# Patient Record
Sex: Female | Born: 1990 | Race: White | Hispanic: No | Marital: Single | Smoking: Never smoker
Health system: Southern US, Community
[De-identification: ages and names within clinical notes are randomized; demographics above are authoritative.]

---

## 2010-01-25 ENCOUNTER — Emergency Department (HOSPITAL_BASED_OUTPATIENT_CLINIC_OR_DEPARTMENT_OTHER): Admission: EM | Admit: 2010-01-25 | Discharge: 2010-01-25 | Payer: Self-pay | Admitting: Emergency Medicine

## 2010-01-25 ENCOUNTER — Ambulatory Visit: Payer: Self-pay | Admitting: Diagnostic Radiology

## 2010-01-28 ENCOUNTER — Emergency Department (HOSPITAL_BASED_OUTPATIENT_CLINIC_OR_DEPARTMENT_OTHER): Admission: EM | Admit: 2010-01-28 | Discharge: 2010-01-28 | Payer: Self-pay | Admitting: Emergency Medicine

## 2010-01-28 ENCOUNTER — Ambulatory Visit: Payer: Self-pay | Admitting: Diagnostic Radiology

## 2011-02-13 LAB — DIFFERENTIAL
Basophils Absolute: 0.1 10*3/uL (ref 0.0–0.1)
Basophils Absolute: 0.1 10*3/uL (ref 0.0–0.1)
Basophils Relative: 1 % (ref 0–1)
Lymphocytes Relative: 21 % (ref 12–46)
Lymphs Abs: 2.3 10*3/uL (ref 0.7–4.0)
Neutro Abs: 8.2 10*3/uL — ABNORMAL HIGH (ref 1.7–7.7)

## 2011-02-13 LAB — COMPREHENSIVE METABOLIC PANEL
ALT: 9 U/L (ref 0–35)
AST: 19 U/L (ref 0–37)
Albumin: 4.4 g/dL (ref 3.5–5.2)
Alkaline Phosphatase: 85 U/L (ref 39–117)
BUN: 7 mg/dL (ref 6–23)
CO2: 24 mEq/L (ref 19–32)
Calcium: 9.5 mg/dL (ref 8.4–10.5)
Calcium: 9.7 mg/dL (ref 8.4–10.5)
Chloride: 107 mEq/L (ref 96–112)
Creatinine, Ser: 0.8 mg/dL (ref 0.4–1.2)
Creatinine, Ser: 0.9 mg/dL (ref 0.4–1.2)
GFR calc Af Amer: >60 mL/min (ref >60)
GFR calc non Af Amer: >60 mL/min (ref >60)
Glucose, Bld: 93 mg/dL (ref 70–99)
Potassium: 3.3 mEq/L — ABNORMAL LOW (ref 3.5–5.1)
Potassium: 4 mEq/L (ref 3.5–5.1)
Sodium: 143 mEq/L (ref 135–145)
Sodium: 144 mEq/L (ref 135–145)
Total Bilirubin: 0.6 mg/dL (ref 0.3–1.2)
Total Protein: 7.5 g/dL (ref 6.0–8.3)
Total Protein: 8 g/dL (ref 6.0–8.3)

## 2011-02-13 LAB — CBC
Hemoglobin: 13.1 g/dL (ref 12.0–15.0)
MCHC: 35 g/dL (ref 30.0–36.0)
Platelets: 249 10*3/uL (ref 150–400)
WBC: 11.1 10*3/uL — ABNORMAL HIGH (ref 4.0–10.5)

## 2011-02-13 LAB — URINALYSIS, ROUTINE W REFLEX MICROSCOPIC
Bilirubin Urine: NEGATIVE
Glucose, UA: NEGATIVE mg/dL
Hgb urine dipstick: NEGATIVE
Nitrite: NEGATIVE
Protein, ur: NEGATIVE mg/dL
pH: 7.5 (ref 5.0–8.0)

## 2011-02-13 LAB — WET PREP, GENITAL
Trich, Wet Prep: NONE SEEN
Yeast Wet Prep HPF POC: NONE SEEN

## 2011-02-13 LAB — PREGNANCY, URINE: Preg Test, Ur: NEGATIVE

## 2011-02-13 LAB — LIPASE, BLOOD
Lipase: 55 U/L (ref 23–300)
Lipase: 68 U/L (ref 23–300)

## 2011-02-13 LAB — GC/CHLAMYDIA PROBE AMP, GENITAL: Chlamydia, DNA Probe: NEGATIVE

## 2011-09-30 IMAGING — CT CT ABD-PELV W/ CM
2 of 4 series · 16 of 46 positions shown, 18 images · IV contrast (APPLIED)
Comparison: Ultrasound of the abdomen 01/25/2010

CLINICAL DATA: Severe abdominal pain in the left upper quadrant.
Pain is getting progressively worse.  Pain is worse after eating.

CT ABDOMEN AND PELVIS WITH CONTRAST
TECHNIQUE: Multidetector CT imaging of the abdomen and pelvis was
performed following the standard protocol during bolus
administration of intravenous contrast.
Contrast: 100 ml Emnipaque-B55

[Series 2: abd/pelvis 5.0 b31f · axial · 0.68mm/px · z∈[-517,-97]mm · 13 of 92 slices shown, 15 images]
[im 4/92  soft-tissue]
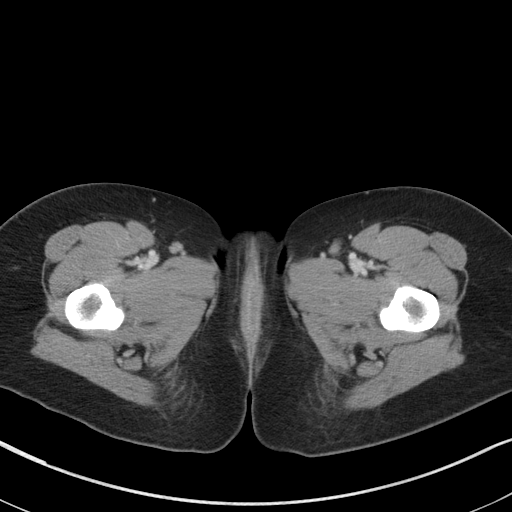
[im 4/92  bone]
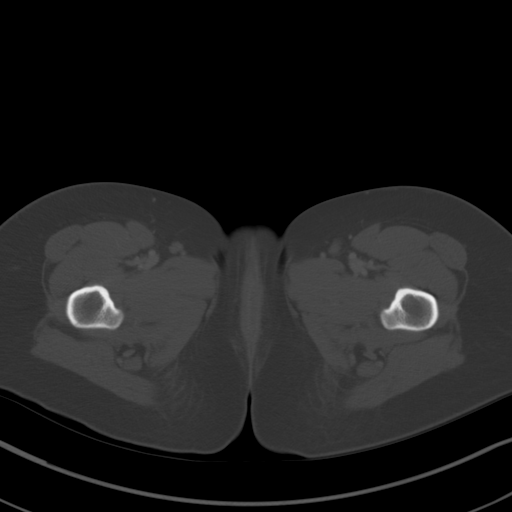
[im 12/92  soft-tissue]
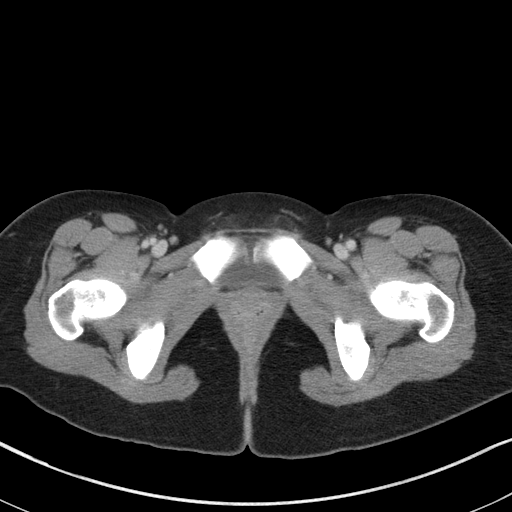
[im 19/92  soft-tissue]
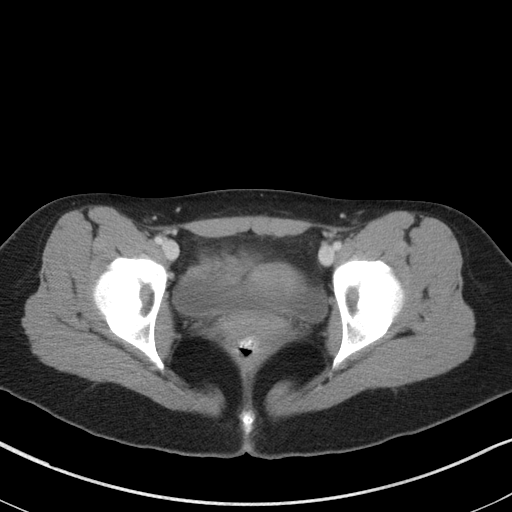
[im 27/92  soft-tissue]
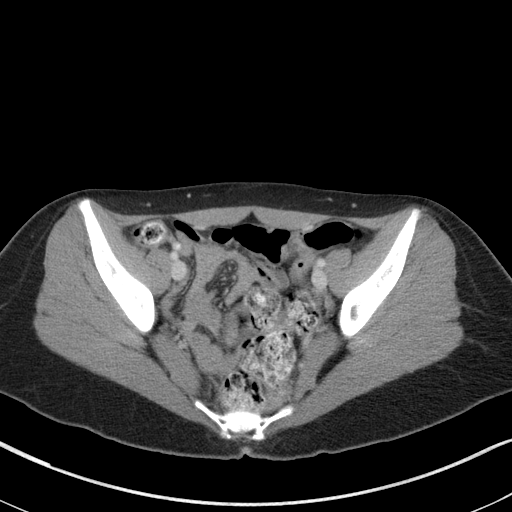
[im 31/92  soft-tissue]
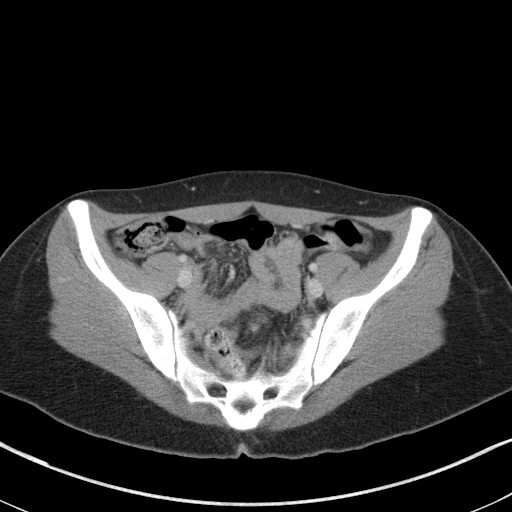
[im 38/92  soft-tissue]
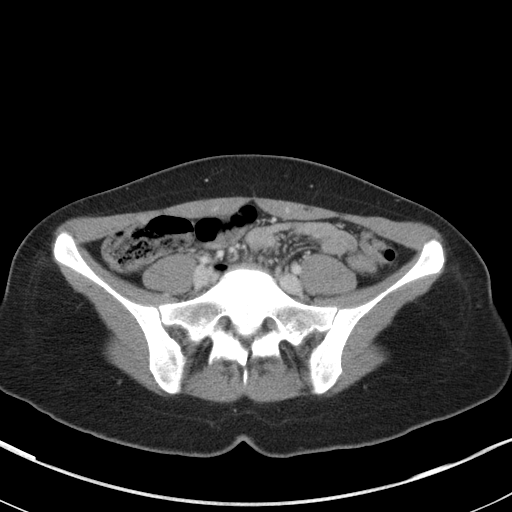
[im 46/92  soft-tissue]
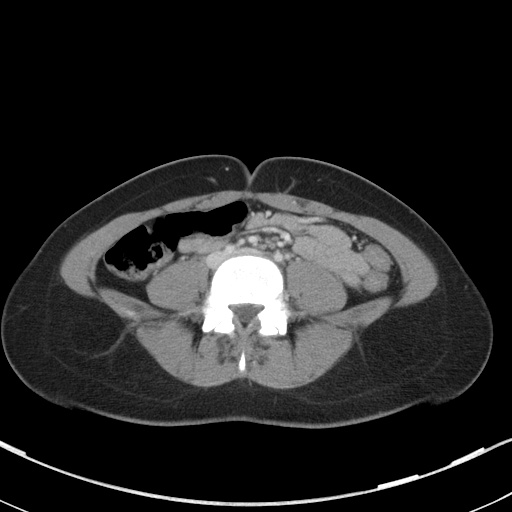
[im 54/92  soft-tissue]
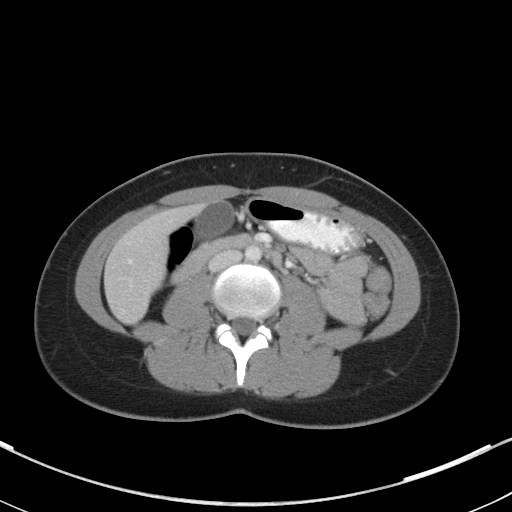
[im 61/92  soft-tissue]
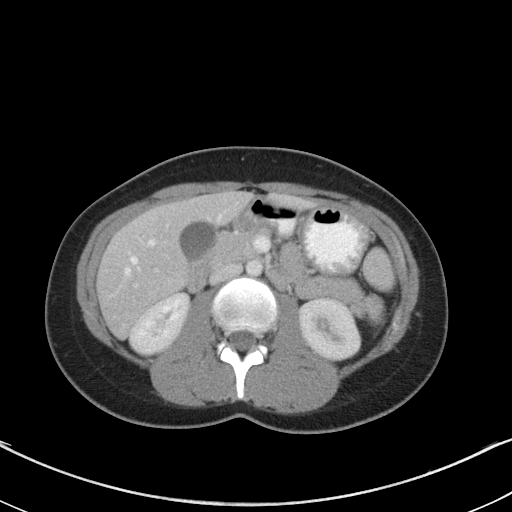
[im 61/92  bone]
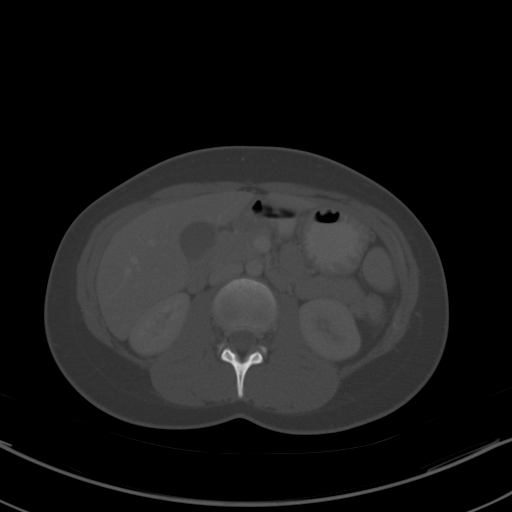
[im 65/92  soft-tissue]
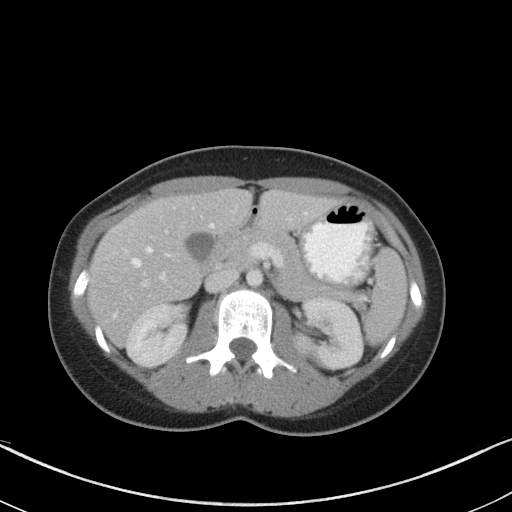
[im 73/92  soft-tissue]
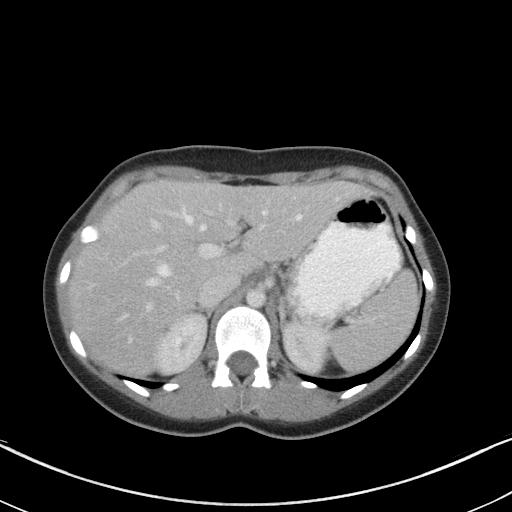
[im 80/92  soft-tissue]
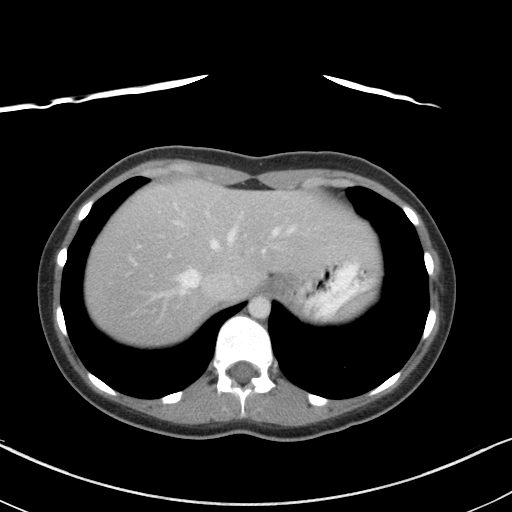
[im 88/92  soft-tissue]
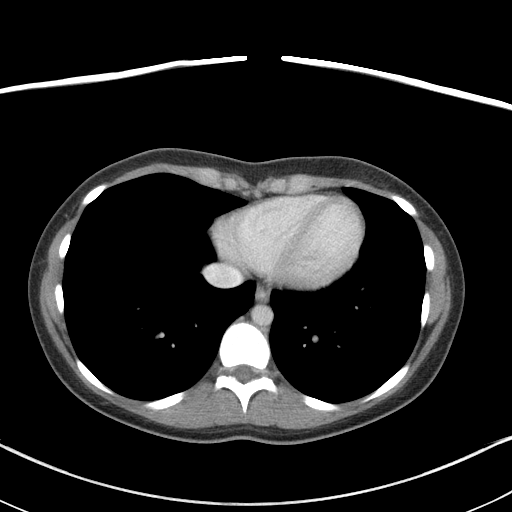

[Series 5: abd/pelvis 3.0 coronal · coronal · 0.69mm/px · 3 of 69 slices shown]
[im 23/69  soft-tissue]
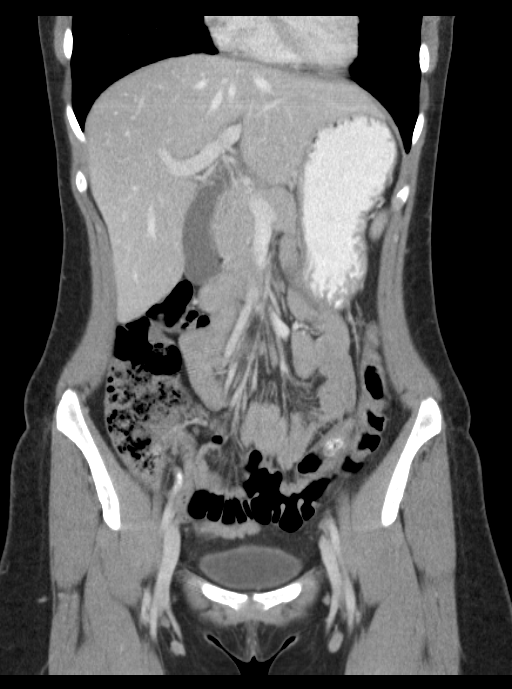
[im 31/69  soft-tissue]
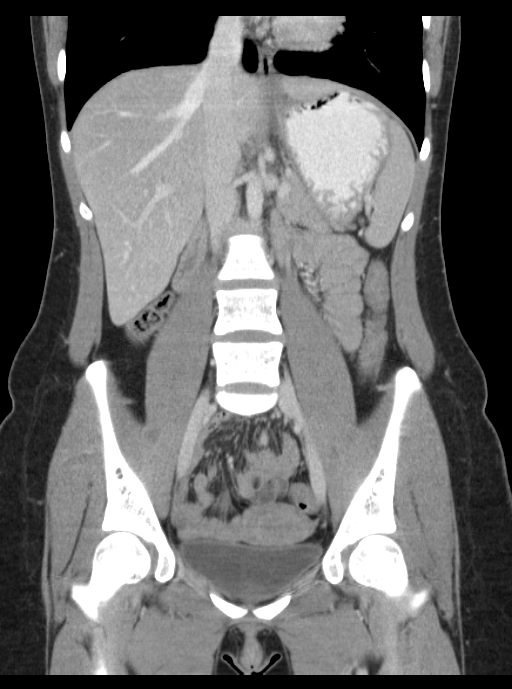
[im 38/69  soft-tissue]
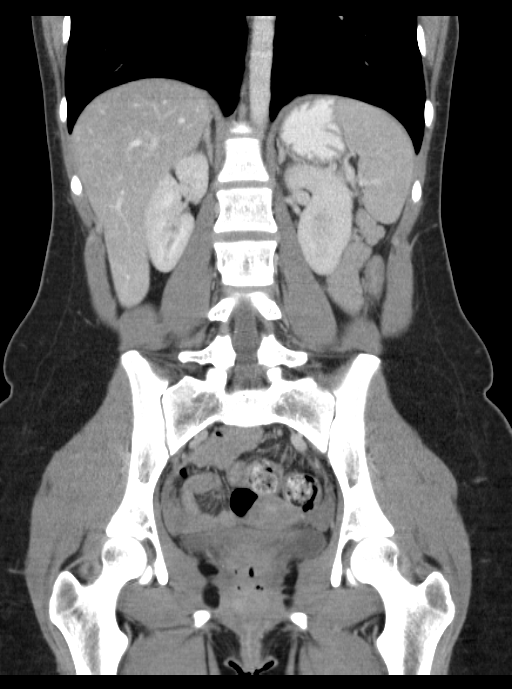

[16 of 46 positions shown; findings below may reference images not displayed]

FINDINGS: Images of the lung bases are unremarkable.  There is
focal fatty infiltration adjacent to the falciform ligament of the
liver.  No other focal abnormalities identified within the liver,
spleen, pancreas, adrenal glands, or kidneys.  The gallbladder is
present.  No retroperitoneal adenopathy or ascites.  The appendix
is well seen and has a normal appearance.  No evidence for bowel
obstruction or bowel wall thickening.

The uterus is present.  No adnexal mass or free pelvic fluid.
IMPRESSION: No evidence for acute abdominal or pelvic abnormality.

## 2014-10-05 ENCOUNTER — Ambulatory Visit (INDEPENDENT_AMBULATORY_CARE_PROVIDER_SITE_OTHER): Payer: BC Managed Care – PPO | Admitting: Emergency Medicine

## 2014-10-05 VITALS — BP 120/65 | HR 92 | Temp 98.3°F | Resp 18 | Ht 65.75 in | Wt 140.0 lb

## 2014-10-05 DIAGNOSIS — G8929 Other chronic pain: Secondary | ICD-10-CM

## 2014-10-05 DIAGNOSIS — R1031 Right lower quadrant pain: Secondary | ICD-10-CM

## 2014-10-05 DIAGNOSIS — Z3202 Encounter for pregnancy test, result negative: Secondary | ICD-10-CM

## 2014-10-05 LAB — POCT CBC
Granulocyte percent: 68.6 %G (ref 37–80)
HCT, POC: 38.5 % (ref 37.7–47.9)
Hemoglobin: 12.2 g/dL (ref 12.2–16.2)
Lymph, poc: 4.8 — AB (ref 0.6–3.4)
MCH, POC: 30 pg (ref 27–31.2)
MCHC: 31.8 g/dL (ref 31.8–35.4)
MCV: 94.2 fL (ref 80–97)
MID (CBC): 0.5 (ref 0–0.9)
MPV: 8.2 fL (ref 0–99.8)
PLATELET COUNT, POC: 249 10*3/uL (ref 142–424)
POC GRANULOCYTE: 11.7 — AB (ref 2–6.9)
POC LYMPH PERCENT: 28.4 %L (ref 10–50)
POC MID %: 3 %M (ref 0–12)
RBC: 4.08 M/uL (ref 4.04–5.48)
RDW, POC: 13.9 %
WBC: 17 10*3/uL — AB (ref 4.6–10.2)

## 2014-10-05 LAB — POCT URINALYSIS DIPSTICK
Bilirubin, UA: NEGATIVE
GLUCOSE UA: NEGATIVE
KETONES UA: NEGATIVE
Leukocytes, UA: NEGATIVE
NITRITE UA: NEGATIVE
PH UA: 5.5
PROTEIN UA: NEGATIVE
RBC UA: NEGATIVE
UROBILINOGEN UA: 0.2

## 2014-10-05 LAB — POCT UA - MICROSCOPIC ONLY
CASTS, UR, LPF, POC: NEGATIVE
CRYSTALS, UR, HPF, POC: NEGATIVE
Mucus, UA: NEGATIVE
Yeast, UA: NEGATIVE

## 2014-10-05 LAB — POCT URINE PREGNANCY: PREG TEST UR: NEGATIVE

## 2014-10-05 NOTE — Progress Notes (Signed)
[facility] [address], [place] [id number] [phone]  Date:  10/05/2014   Name:  Brooke Romero   DOB:  06/19/1991   MRN:  [id number]  PCP:  No primary care provider on file.    Chief Complaint: Abdominal Pain   History of Present Illness:  Brooke Romero is a 23 y.o. very pleasant female patient who presents with the following:  Pain in right abdomen that is crampy in nature for the past 10 days.  No vaginal discharge or dyspareunia.   On OCP LMP 09/11/14 and normal   No missed pills. Has had intermittent cramps until yesterday and was worse.  Today constant and much worse. No fever or chills.  No dysuria or urgency or frequency. Vomited once this morning. Now anorectic.  Moved stools twice today. No cough or coryza. No improvement with over the counter medications or other home remedies.  Denies other complaint or health concern today.   There are no active problems to display for this patient.   No past medical history on file.  No past surgical history on file.  History  Substance Use Topics  . Smoking status: Never Smoker   . Smokeless tobacco: Not on file  . Alcohol Use: 0.0 oz/week    0 Not specified per week    No family history on file.  Allergies  Allergen Reactions  . Amoxicillin   . Augmentin [Amoxicillin-Pot Clavulanate]     Medication list has been reviewed and updated.  No current outpatient prescriptions on file prior to visit.   No current facility-administered medications on file prior to visit.    Review of Systems:  As per HPI, otherwise negative.    Physical Examination: Filed Vitals:   10/05/14 2030  BP: 120/65  Pulse: 92  Temp: 98.3 F (36.8 C)  Resp: 18   Filed Vitals:   10/05/14 2030  Height: 5' 5.75" (1.67 m)  Weight: 140 lb (63.504 kg)   Body mass index is 22.77 kg/(m^2). Ideal Body Weight: Weight in (lb) to have BMI = 25: 153.4  GEN: WDWN, minimally ill appearing.  Well hydrated,  Non-toxic, A & O x 3 HEENT: Atraumatic, Normocephalic. Neck supple. No masses, No LAD. Ears and Nose: No external deformity. CV: RRR, No M/G/R. No JVD. No thrill. No extra heart sounds. PULM: CTA B, no wheezes, crackles, rhonchi. No retractions. No resp. distress. No accessory muscle use. ABD: S, tender right abdomen with no guarding., ND, +BS. Questionable direct rebound. No HSM. EXTR: No c/c/e NEURO Normal gait.  PSYCH: Normally interactive. Conversant. Not depressed or anxious appearing.  Calm demeanor.    Assessment and Plan: Acute abdomen To ER via POV  Signed,  Phillips OdorJeffery Anderson, MD   Results for orders placed or performed in visit on 10/05/14  POCT UA - Microscopic Only  Result Value Ref Range   WBC, Ur, HPF, POC 2-4    RBC, urine, microscopic 0-2    Bacteria, U Microscopic trace    Mucus, UA neg    Epithelial cells, urine per micros 2-4    Crystals, Ur, HPF, POC neg    Casts, Ur, LPF, POC neg    Yeast, UA neg   POCT urinalysis dipstick  Result Value Ref Range   Color, UA yellow    Clarity, UA clear    Glucose, UA neg    Bilirubin, UA neg    Ketones, UA neg    Spec Grav, UA >=1.030  Blood, UA neg    pH, UA 5.5    Protein, UA neg    Urobilinogen, UA 0.2    Nitrite, UA neg    Leukocytes, UA Negative   POCT CBC  Result Value Ref Range   WBC 17.0 (A) 4.6 - 10.2 K/uL   Lymph, poc 4.8 (A) 0.6 - 3.4   POC LYMPH PERCENT 28.4 10 - 50 %L   MID (cbc) 0.5 0 - 0.9   POC MID % 3.0 0 - 12 %M   POC Granulocyte 11.7 (A) 2 - 6.9   Granulocyte percent 68.6 37 - 80 %G   RBC 4.08 4.04 - 5.48 M/uL   Hemoglobin 12.2 12.2 - 16.2 g/dL   HCT, POC 09.838.5 11.937.7 - 47.9 %   MCV 94.2 80 - 97 fL   MCH, POC 30.0 27 - 31.2 pg   MCHC 31.8 31.8 - 35.4 g/dL   RDW, POC 14.713.9 %   Platelet Count, POC 249 142 - 424 K/uL   MPV 8.2 0 - 99.8 fL  POCT urine pregnancy  Result Value Ref Range   Preg Test, Ur Negative

## 2023-09-14 ENCOUNTER — Emergency Department (HOSPITAL_BASED_OUTPATIENT_CLINIC_OR_DEPARTMENT_OTHER)
Admission: EM | Admit: 2023-09-14 | Discharge: 2023-09-14 | Disposition: A | Discharge status: Home / Self Care | Payer: Commercial Managed Care - PPO | Attending: Emergency Medicine | Admitting: Emergency Medicine

## 2023-09-14 ENCOUNTER — Other Ambulatory Visit: Payer: Self-pay

## 2023-09-14 ENCOUNTER — Emergency Department (HOSPITAL_BASED_OUTPATIENT_CLINIC_OR_DEPARTMENT_OTHER): Payer: Commercial Managed Care - PPO

## 2023-09-14 ENCOUNTER — Other Ambulatory Visit (HOSPITAL_BASED_OUTPATIENT_CLINIC_OR_DEPARTMENT_OTHER): Payer: Self-pay

## 2023-09-14 ENCOUNTER — Encounter (HOSPITAL_BASED_OUTPATIENT_CLINIC_OR_DEPARTMENT_OTHER): Payer: Self-pay

## 2023-09-14 DIAGNOSIS — Z23 Encounter for immunization: Secondary | ICD-10-CM | POA: Insufficient documentation

## 2023-09-14 DIAGNOSIS — S61511A Laceration without foreign body of right wrist, initial encounter: Secondary | ICD-10-CM | POA: Insufficient documentation

## 2023-09-14 DIAGNOSIS — S01311A Laceration without foreign body of right ear, initial encounter: Secondary | ICD-10-CM | POA: Insufficient documentation

## 2023-09-14 DIAGNOSIS — W25XXXA Contact with sharp glass, initial encounter: Secondary | ICD-10-CM | POA: Diagnosis not present

## 2023-09-14 DIAGNOSIS — S01319A Laceration without foreign body of unspecified ear, initial encounter: Secondary | ICD-10-CM

## 2023-09-14 MED ORDER — TETANUS-DIPHTH-ACELL PERTUSSIS 5-2.5-18.5 LF-MCG/0.5 IM SUSY
0.5000 mL | PREFILLED_SYRINGE | Freq: Once | INTRAMUSCULAR | Status: AC
  Administered 2023-09-14: 0.5 mL via INTRAMUSCULAR
  Filled 2023-09-14: qty 0.5

## 2023-09-14 MED ORDER — HYDROCODONE-ACETAMINOPHEN 5-325 MG PO TABS
1.0000 | ORAL_TABLET | Freq: Once | ORAL | Status: AC
  Administered 2023-09-14: 1 via ORAL
  Filled 2023-09-14: qty 1

## 2023-09-14 MED ORDER — LIDOCAINE HCL (PF) 1 % IJ SOLN
5.0000 mL | Freq: Once | INTRAMUSCULAR | Status: AC
  Administered 2023-09-14: 5 mL
  Filled 2023-09-14: qty 5

## 2023-09-14 MED ORDER — LIDOCAINE-EPINEPHRINE (PF) 2 %-1:200000 IJ SOLN
10.0000 mL | Freq: Once | INTRAMUSCULAR | Status: AC
  Administered 2023-09-14: 10 mL
  Filled 2023-09-14: qty 20

## 2023-09-14 MED ORDER — ONDANSETRON 4 MG PO TBDP
4.0000 mg | ORAL_TABLET | Freq: Once | ORAL | Status: AC
  Administered 2023-09-14: 4 mg via ORAL
  Filled 2023-09-14: qty 1

## 2023-09-14 MED ORDER — HYDROCODONE-ACETAMINOPHEN 5-325 MG PO TABS
1.0000 | ORAL_TABLET | Freq: Four times a day (QID) | ORAL | 0 refills | Status: AC | PRN
  Filled 2023-09-14: qty 10, 3d supply, fill #0

## 2023-09-14 NOTE — ED Triage Notes (Addendum)
Pt reports she was going out of sliding glass door this morning. Locked herself out. 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 2 lacerations noted to right posterior wrist. Lac to right ear. Dressing applied to right wrist to control bleeding. No thinners

## 2023-09-14 NOTE — ED Provider Notes (Addendum)
[place] [facility] Provider Note   CSN: [id number] Arrival date & time: 09/14/23  1324     History  Chief Complaint  Patient presents with   Laceration    Brooke Romero is a 32 y.o. female.  Patient had a break a window to get back into her residence.  In the process sustaining jagged lacerations to right anterior wrist and the edge of the right pinna.  Patient not sure if tetanus up-to-date.  Thinks the last when she had maybe was in high school.  Denies any other injuries.  Bleeding controlled.       Home Medications Prior to Admission medications   Medication Sig Start Date End Date Taking? Authorizing Provider  drospirenone-ethinyl estradiol (YAZ,GIANVI,LORYNA) 3-0.02 MG tablet Take 1 tablet by mouth daily.    [provider]      Allergies    Amoxicillin and Augmentin [amoxicillin-pot clavulanate]    Review of Systems   Review of Systems  Constitutional:  Negative for chills and fever.  HENT:  Negative for ear pain and sore throat.   Eyes:  Negative for pain and visual disturbance.  Respiratory:  Negative for cough and shortness of breath.   Cardiovascular:  Negative for chest pain and palpitations.  Gastrointestinal:  Negative for abdominal pain and vomiting.  Genitourinary:  Negative for dysuria and hematuria.  Musculoskeletal:  Negative for arthralgias and back pain.  Skin:  Positive for wound. Negative for color change and rash.  Neurological:  Negative for seizures and syncope.  All other systems reviewed and are negative.   Physical Exam Updated Vital Signs BP (!) 138/113   Pulse (!) 101   Temp 98.6 F (37 C)   Resp 16   Ht 1.676 m (5\' 6" )   Wt 68 kg   LMP 08/31/2023   SpO2 100%   BMI 24.21 kg/m  Physical Exam Vitals and nursing note reviewed.  Constitutional:      General: She is not in acute distress.    Appearance: Normal appearance. She is well-developed.  HENT:     Head: Normocephalic.      Comments: Right pinna initially appeared to just to have 2 separate wedge type lacerations right along the edge.  But there is also a laceration along the inner edge of the pannus of the skin all peels back.  Laceration measuring about 3 cm    Mouth/Throat:     Mouth: Mucous membranes are moist.  Eyes:     Extraocular Movements: Extraocular movements intact.     Conjunctiva/sclera: Conjunctivae normal.     Pupils: Pupils are equal, round, and reactive to light.  Cardiovascular:     Rate and Rhythm: Normal rate and regular rhythm.     Heart sounds: No murmur heard. Pulmonary:     Effort: Pulmonary effort is normal. No respiratory distress.     Breath sounds: Normal breath sounds.  Abdominal:     Palpations: Abdomen is soft.     Tenderness: There is no abdominal tenderness.  Musculoskeletal:        General: Signs of injury present. No swelling.     Cervical back: Neck supple.     Comments: Right hand movement of fingers all intact.  Patient states there is some numbness globally does not seem to follow median ulnar or radial nerves.  Right anterior wrist with jagged lacerations totaling about 8 cm.  No evidence of any foreign body.  Radial pulse 2+ no significant bleeding.  And good cap refill to the tips of all fingers.  Skin:    General: Skin is warm and dry.     Capillary Refill: Capillary refill takes less than 2 seconds.  Neurological:     General: No focal deficit present.     Mental Status: She is alert and oriented to person, place, and time.  Psychiatric:        Mood and Affect: Mood normal.     ED Results / Procedures / Treatments   Labs (all labs ordered are listed, but only abnormal results are displayed) Labs Reviewed - No data to display  EKG None  Radiology No results found.  Procedures .Marland KitchenLaceration Repair  Date/Time: 09/14/2023 2:18 PM  Performed by: Vanetta Mulders, MD Authorized by: Vanetta Mulders, MD   Consent:    Consent obtained:  Verbal    Consent given by:  Patient   Risks, benefits, and alternatives were discussed: yes     Risks discussed:  Infection, pain, retained foreign body and nerve damage Universal protocol:    Procedure explained and questions answered to patient or proxy's satisfaction: yes     Relevant documents present and verified: yes     Imaging studies available: yes     Immediately prior to procedure, a time out was called: yes     Patient identity confirmed:  Verbally with patient Anesthesia:    Anesthesia method:  Local infiltration   Local anesthetic:  Lidocaine 2% WITH epi Laceration details:    Length (cm):  24   Depth (mm):  3 Pre-procedure details:    Preparation:  Patient was prepped and draped in usual sterile fashion and imaging obtained to evaluate for foreign bodies Exploration:    Limited defect created (wound extended): no     Hemostasis achieved with:  Epinephrine   Imaging obtained: x-ray     Imaging outcome: foreign body not noted     Wound exploration: wound explored through full range of motion     Wound extent: areolar tissue violated     Wound extent: no foreign body, no signs of injury, no nerve damage, no tendon damage, no underlying fracture and no vascular damage     Contaminated: no   Treatment:    Area cleansed with:  Shur-Clens   Amount of cleaning:  Standard   Irrigation solution:  Sterile saline   Irrigation method:  Syringe   Visualized foreign bodies/material removed: no     Debridement:  Moderate   Undermining:  None   Scar revision: no   Skin repair:    Repair method:  Sutures   Suture size:  4-0   Suture material:  Prolene   Suture technique:  Simple interrupted   Number of sutures:  24 Approximation:    Approximation:  Close Repair type:    Repair type:  Complex Post-procedure details:    Dressing:  Antibiotic ointment and non-adherent dressing   Procedure completion:  Tolerated well, no immediate complications .Marland KitchenLaceration Repair  Date/Time:  09/14/2023 2:25 PM  Performed by: Vanetta Mulders, MD Authorized by: Vanetta Mulders, MD   Consent:    Consent obtained:  Verbal   Consent given by:  Patient   Risks discussed:  Infection, pain, poor cosmetic result and need for additional repair   Alternatives discussed:  Referral Universal protocol:    Procedure explained and questions answered to patient or proxy's satisfaction: yes     Relevant documents present and verified: yes     Immediately prior to  procedure, a time out was called: yes     Patient identity confirmed:  Verbally with patient Anesthesia:    Anesthesia method:  Local infiltration   Local anesthetic:  Lidocaine 1% w/o epi Laceration details:    Location:  Ear   Ear location:  R ear   Length (cm):  3   Depth (mm):  3 Pre-procedure details:    Preparation:  Patient was prepped and draped in usual sterile fashion Exploration:    Limited defect created (wound extended): no     Hemostasis achieved with:  Epinephrine   Wound exploration: wound explored through full range of motion and entire depth of wound visualized     Contaminated: no   Treatment:    Area cleansed with:  Shur-Clens   Amount of cleaning:  Standard   Irrigation solution:  Sterile saline   Irrigation method:  Syringe   Visualized foreign bodies/material removed: no     Debridement:  None   Undermining:  None   Scar revision: no   Skin repair:    Repair method:  Sutures   Suture size:  5-0   Suture material:  Fast-absorbing gut   Number of sutures:  4 Approximation:    Approximation:  Loose Repair type:    Repair type:  Simple Post-procedure details:    Dressing:  Antibiotic ointment   Procedure completion:  Tolerated well, no immediate complications Comments:     Underneath the lip of the upper part of the pinna was left open to heal naturally so they were not be a defect created by trying to tack that down.  Cosmetic results initially were very good.     Medications Ordered in  ED Medications  HYDROcodone-acetaminophen (NORCO/VICODIN) 5-325 MG per tablet 1 tablet (has no administration in time range)  ondansetron (ZOFRAN-ODT) disintegrating tablet 4 mg (has no administration in time range)  Tdap (BOOSTRIX) injection 0.5 mL (has no administration in time range)  lidocaine-EPINEPHrine (XYLOCAINE W/EPI) 2 %-1:200000 (PF) injection 10 mL (has no administration in time range)    ED Course/ Medical Decision Making/ A&P                                 Medical Decision Making Amount and/or Complexity of Data Reviewed Radiology: ordered.  Risk Prescription drug management.   Will get x-ray will update tetanus will give hydrocodone for pain.  X-ray will be of the right wrist just to rule out any foreign body.  This will require an extensive repair.  Will reassess concerns for any nerve injury since patient seems to have sort of a global numbness.  X-ray of the right anterior wrist area without evidence of any foreign bodies.  Suture pair of the right wrist went very well.  Sutures what to be removed in 7 to 10 days.  Suture repair of the earlobe laceration was tacked down with 4 sutures with very reasonable cosmetic results.  Still open underneath the curl of the pinna but that should heal on its own.      Final Clinical Impression(s) / ED Diagnoses Final diagnoses:  Complex laceration of ear, initial encounter  Wrist laceration, right, initial encounter    Rx / DC Orders ED Discharge Orders     None         Vanetta Mulders, MD 09/14/23 1610    Vanetta Mulders, MD 09/14/23 [id number]    Vanetta Mulders, MD 09/14/23 1429

## 2023-09-14 NOTE — Discharge Instructions (Signed)
Apply bacitracin ointment to the wounds twice a day.  Suture removal required for the wrist laceration and 7 to 10 days.  The sutures in the right earlobe will dissolve.  Keep the areas dry for 24 hours.  Then you can shower.  Return for any new or worse symptoms.  Work note provided.  Prescription for hydrocodone available at the pharmacy here to pick up.

## 2023-12-10 DIAGNOSIS — Z719 Counseling, unspecified: Secondary | ICD-10-CM

## 2024-01-07 ENCOUNTER — Encounter (INDEPENDENT_AMBULATORY_CARE_PROVIDER_SITE_OTHER): Payer: Self-pay

## 2024-01-07 DIAGNOSIS — Z719 Counseling, unspecified: Secondary | ICD-10-CM

## 2024-02-01 DIAGNOSIS — Z719 Counseling, unspecified: Secondary | ICD-10-CM

## 2024-02-04 ENCOUNTER — Encounter (INDEPENDENT_AMBULATORY_CARE_PROVIDER_SITE_OTHER): Payer: Self-pay

## 2024-03-03 DIAGNOSIS — Z719 Counseling, unspecified: Secondary | ICD-10-CM

## 2024-03-31 ENCOUNTER — Encounter (INDEPENDENT_AMBULATORY_CARE_PROVIDER_SITE_OTHER): Payer: Self-pay

## 2024-03-31 DIAGNOSIS — Z719 Counseling, unspecified: Secondary | ICD-10-CM

## 2024-04-28 ENCOUNTER — Encounter

## 2024-06-30 DIAGNOSIS — Z719 Counseling, unspecified: Secondary | ICD-10-CM

## 2024-07-28 ENCOUNTER — Encounter

## 2024-08-18 ENCOUNTER — Encounter (INDEPENDENT_AMBULATORY_CARE_PROVIDER_SITE_OTHER): Payer: Self-pay

## 2024-08-18 DIAGNOSIS — Z719 Counseling, unspecified: Secondary | ICD-10-CM

## 2024-10-13 DIAGNOSIS — Z719 Counseling, unspecified: Secondary | ICD-10-CM

## 2024-10-28 ENCOUNTER — Other Ambulatory Visit (HOSPITAL_COMMUNITY): Payer: Self-pay | Admitting: Physician Assistant

## 2024-10-28 DIAGNOSIS — R070 Pain in throat: Secondary | ICD-10-CM

## 2024-10-28 DIAGNOSIS — H9202 Otalgia, left ear: Secondary | ICD-10-CM

## 2024-11-10 ENCOUNTER — Ambulatory Visit (HOSPITAL_COMMUNITY)

## 2024-11-10 ENCOUNTER — Encounter (HOSPITAL_COMMUNITY): Payer: Self-pay

## 2024-11-10 ENCOUNTER — Encounter

## 2024-11-10 DIAGNOSIS — Z719 Counseling, unspecified: Secondary | ICD-10-CM
